# Patient Record
Sex: Female | Born: 1975 | Race: Black or African American | Hispanic: No | Marital: Single
Health system: Southern US, Community
[De-identification: ages and names within clinical notes are randomized; demographics above are authoritative.]

## PROBLEM LIST (undated history)

## (undated) HISTORY — PX: ABDOMINAL HYSTERECTOMY: SHX81

---

## 2019-06-13 ENCOUNTER — Other Ambulatory Visit: Payer: Self-pay

## 2020-03-07 ENCOUNTER — Ambulatory Visit: Payer: Medicaid Other

## 2020-03-07 ENCOUNTER — Other Ambulatory Visit: Payer: Self-pay

## 2020-10-19 ENCOUNTER — Emergency Department: Payer: No Typology Code available for payment source

## 2020-10-19 ENCOUNTER — Emergency Department
Admission: EM | Admit: 2020-10-19 | Discharge: 2020-10-19 | Disposition: A | Discharge status: Home / Self Care | Payer: No Typology Code available for payment source | Attending: Physician Assistant | Admitting: Physician Assistant

## 2020-10-19 ENCOUNTER — Other Ambulatory Visit: Payer: Self-pay

## 2020-10-19 DIAGNOSIS — Y92410 Unspecified street and highway as the place of occurrence of the external cause: Secondary | ICD-10-CM | POA: Diagnosis not present

## 2020-10-19 DIAGNOSIS — S63501A Unspecified sprain of right wrist, initial encounter: Secondary | ICD-10-CM | POA: Insufficient documentation

## 2020-10-19 DIAGNOSIS — S6991XA Unspecified injury of right wrist, hand and finger(s), initial encounter: Secondary | ICD-10-CM | POA: Diagnosis present

## 2020-10-19 MED ORDER — NAPROXEN 500 MG PO TABS: 500.0000 mg | ORAL_TABLET | Freq: Two times a day (BID) | ORAL | Status: AC

## 2020-10-19 NOTE — Discharge Instructions (Signed)
No acute findings on x-ray of the right wrist.  Follow discharge care instruction and wear wrist support while working for 3 to 5 days.  Take medication as directed.  Do not sleep in splint.
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
## 2020-10-25 ENCOUNTER — Encounter: Payer: Self-pay | Admitting: Emergency Medicine

## 2020-10-25 ENCOUNTER — Other Ambulatory Visit: Payer: Self-pay

## 2020-10-25 ENCOUNTER — Emergency Department
Admission: EM | Admit: 2020-10-25 | Discharge: 2020-10-25 | Disposition: A | Discharge status: Home / Self Care | Payer: Medicaid Other | Attending: Emergency Medicine | Admitting: Emergency Medicine

## 2020-10-25 DIAGNOSIS — L02412 Cutaneous abscess of left axilla: Secondary | ICD-10-CM | POA: Insufficient documentation

## 2020-10-25 MED ORDER — LIDOCAINE HCL (PF) 1 % IJ SOLN
10.0000 mL | Freq: Once | INTRAMUSCULAR | Status: AC
  Administered 2020-10-25: 10 mL
  Filled 2020-10-25: qty 10

## 2020-10-25 MED ORDER — SULFAMETHOXAZOLE-TRIMETHOPRIM 800-160 MG PO TABS: 1.0000 | ORAL_TABLET | Freq: Two times a day (BID) | ORAL | 0 refills | Status: AC

## 2020-10-25 MED ORDER — SULFAMETHOXAZOLE-TRIMETHOPRIM 800-160 MG PO TABS
1.0000 | ORAL_TABLET | Freq: Once | ORAL | Status: AC
  Administered 2020-10-25: 1 via ORAL
  Filled 2020-10-25: qty 1

## 2020-10-25 NOTE — Discharge Instructions (Signed)
You have been seen in the Emergency Department (ED) today for an abscess.  This was drained in the ED.  Please follow up with your doctor or an urgent care in 2-3 days for recheck of your wound.  Read through the additional discharge instructions included below regarding wound care recommendations.  Keep the wound clean and dry, though you may wash as you would normally.  Change the dressing twice daily.  Call your doctor sooner or return to the ED if you develop worsening signs of infection such as: increased redness, increased pain, pus, or fever.

## 2020-10-25 NOTE — ED Provider Notes (Signed)
[facility] Emergency Department Provider Note  ____________________________________________   Event Date/Time   First MD Initiated Contact with Patient 10/25/20 [phone]     (approximate)  I have reviewed the triage vital signs and the nursing notes.   HISTORY  Chief Complaint Abscess    HPI Katrina Erickson is a 45 y.o. female who presents for evaluation of a raised and painful area that she describes as a "boil" in her left armpit that is slowly been developing over 2 weeks and is now severe.  She said it hurts when she moves her arm around and applies pressure.  She has never had this type of  abscess or boil in the past.  She said that she changed from Verlot to a different type of depilatory product and she thinks that is what caused the problem.  She denies fever, nausea, vomiting, or any other symptoms.  She has no pain or swelling radiating away from the oval or egg shaped area beneath her left arm.  Nothing particular makes it better.        History reviewed. No pertinent past medical history.  There are no problems to display for this patient.   Past Surgical History:  Procedure Laterality Date  . ABDOMINAL HYSTERECTOMY      Prior to Admission medications   Medication Sig Start Date End Date Taking? Authorizing Provider  sulfamethoxazole-trimethoprim (BACTRIM DS) 800-160 MG tablet Take 1 tablet by mouth 2 (two) times daily for 5 days. 10/25/20 10/30/20 Yes Loleta Rose, MD  naproxen (NAPROSYN) 500 MG tablet Take 1 tablet (500 mg total) by mouth 2 (two) times daily with a meal. 10/19/20   Joni Reining, PA-C    Allergies Penicillins  No family history on file.  Social History    Review of Systems Constitutional: No fever/chills Cardiovascular: Denies chest pain. Respiratory: Denies shortness of breath. Gastrointestinal: No nausea nor vomiting. Genitourinary: Negative for dysuria. Musculoskeletal: Negative for neck pain.  Negative for  back pain. Integumentary: "Boil" under her left arm Neurological: Negative for headaches, focal weakness or numbness.   ____________________________________________   PHYSICAL EXAM:  VITAL SIGNS: ED Triage Vitals  Enc Vitals Group     BP 10/25/20 0621 135/84     Pulse Rate 10/25/20 0621 86     Resp 10/25/20 0621 18     Temp 10/25/20 0621 98.3 F (36.8 C)     Temp Source 10/25/20 0621 Oral     SpO2 10/25/20 0621 96 %     Weight 10/25/20 0619 97.5 kg (215 lb)     Height 10/25/20 0619 1.626 m (5\' 4" )     Head Circumference --      Peak Flow --      Pain Score 10/25/20 0619 9     Pain Loc --      Pain Edu? --      Excl. in GC? --     Constitutional: Alert and oriented.  Eyes: Conjunctivae are normal.  Head: Atraumatic. Cardiovascular: Normal rate, regular rhythm. Good peripheral circulation. Respiratory: Normal respiratory effort.  No retractions. Gastrointestinal: Soft and nondistended. Neurologic:  Normal speech and language. No gross focal neurologic deficits are appreciated.  Skin: Patient has an abscess in the left axilla with a fluctuant area measuring about 4-1/2 cm in diameter with an additional 1 cm on each side of induration.  No surrounding erythema or suggestion of cellulitis.  The area is tender and it is consistent with a MRSA abscess. Psychiatric: Mood  and affect are normal. Speech and behavior are normal.  ____________________________________________   LABS (all labs ordered are listed, but only abnormal results are displayed)  Labs Reviewed  AEROBIC/ANAEROBIC CULTURE W GRAM STAIN (SURGICAL/DEEP WOUND)   ____________________________________________   PROCEDURES   Procedure(s) performed (including Critical Care):  Marland KitchenMarland KitchenIncision and Drainage  Date/Time: 10/25/2020 6:55 AM Performed by: Loleta Rose, MD Authorized by: Loleta Rose, MD   Consent:    Consent obtained:  Verbal   Consent given by:  Patient   Risks discussed:  Bleeding, infection,  incomplete drainage and pain   Alternatives discussed:  Alternative treatment, delayed treatment and observation Universal protocol:    Patient identity confirmed:  Verbally with patient Location:    Type:  Abscess   Size:  4.5 cm   Location:  Upper extremity   Upper extremity location:  Arm   Arm location: L axilla. Pre-procedure details:    Skin preparation:  Antiseptic wash Anesthesia:    Anesthesia method:  Local infiltration (ring block)   Local anesthetic:  Lidocaine 1% w/o epi Procedure type:    Complexity:  Complex Procedure details:    Incision types:  Single straight   Wound management:  Probed and deloculated   Drainage:  Bloody and purulent   Drainage amount:  Copious   Wound treatment:  Wound left open   Packing materials:  None Post-procedure details:    Procedure completion:  Tolerated well, no immediate complications     ____________________________________________   INITIAL IMPRESSION / MDM / ASSESSMENT AND PLAN / ED COURSE  As part of my medical decision making, I reviewed the following data within the electronic MEDICAL RECORD NUMBER Nursing notes reviewed and incorporated and Notes from prior ED visits  The patient's presentation is consistent with abscess in the left axilla.  After discussing various treatment options, we agreed to proceed with incision and drainage.  After performing a ring block around the abscess, I opened it with an 11 blade and had copious drainage of purulence and blood.  I thoroughly explored with hemostats and broke up some loculations.  Based on the size of the abscess, I elected not to pack the wound with gauze.  I had my usual customary post I&D discussion with the patient and she understands to follow-up for a wound check in a few days at an urgent care or her regular doctor.  I gave my usual and customary return precautions.  ____________________________________________  FINAL CLINICAL IMPRESSION(S) / ED DIAGNOSES  Final  diagnoses:  Abscess of left axilla     MEDICATIONS GIVEN DURING THIS VISIT:  Medications  lidocaine (PF) (XYLOCAINE) 1 % injection 10 mL (10 mLs Other Given by Other 10/25/20 4098)  sulfamethoxazole-trimethoprim (BACTRIM DS) 800-160 MG per tablet 1 tablet (1 tablet Oral Given 10/25/20 0703)     ED Discharge Orders         Ordered    sulfamethoxazole-trimethoprim (BACTRIM DS) 800-160 MG tablet  2 times daily        10/25/20 1191          *Please note:  Katrina [place] W[place] was evaluated in Emergency Department on 10/25/2020 for the symptoms described in the history of present illness. She was evaluated in the context of the global COVID-19 pandemic, which necessitated consideration that the patient might be at risk for infection with the SARS-CoV-2 virus that causes COVID-19. Institutional protocols and algorithms that pertain to the evaluation of patients at risk for COVID-19 are in a state of rapid change based  on information released by regulatory bodies including the CDC and federal and state organizations. These policies and algorithms were followed during the patient's care in the ED.  Some ED evaluations and interventions may be delayed as a result of limited staffing during and after the pandemic.*  Note:  This document was prepared using Dragon voice recognition software and may include unintentional dictation errors.   Loleta Rose, MD 10/25/20 [phone]

## 2020-10-25 NOTE — ED Notes (Signed)
Abscess dressed with clean dry gauze prior to d/c.

## 2020-10-25 NOTE — ED Triage Notes (Signed)
Patient ambulatory to triage with steady gait, without difficulty or distress noted; pt reports abscess to left axillae x 2wks

## 2020-10-30 LAB — AEROBIC/ANAEROBIC CULTURE W GRAM STAIN (SURGICAL/DEEP WOUND): Gram Stain: NONE SEEN
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
